# Patient Record
Sex: Male | Born: 1980 | Race: White | Hispanic: No | Marital: Single
Health system: Southern US, Community
[De-identification: ages and names within clinical notes are randomized; demographics above are authoritative.]

## PROBLEM LIST (undated history)

## (undated) HISTORY — PX: HERNIA REPAIR: SHX51

---

## 2010-12-09 ENCOUNTER — Other Ambulatory Visit: Payer: Self-pay | Admitting: Psychiatry

## 2010-12-09 ENCOUNTER — Ambulatory Visit
Admission: RE | Admit: 2010-12-09 | Discharge: 2010-12-09 | Disposition: A | Discharge status: Home / Self Care | Payer: No Typology Code available for payment source | Source: Ambulatory Visit | Attending: Psychiatry | Admitting: Psychiatry

## 2010-12-09 DIAGNOSIS — R7611 Nonspecific reaction to tuberculin skin test without active tuberculosis: Secondary | ICD-10-CM

## 2012-08-21 ENCOUNTER — Encounter (HOSPITAL_COMMUNITY): Payer: Self-pay

## 2012-08-21 ENCOUNTER — Emergency Department (HOSPITAL_COMMUNITY): Payer: BC Managed Care – PPO

## 2012-08-21 ENCOUNTER — Emergency Department (HOSPITAL_COMMUNITY)
Admission: EM | Admit: 2012-08-21 | Discharge: 2012-08-21 | Disposition: A | Discharge status: Home / Self Care | Payer: BC Managed Care – PPO | Attending: Emergency Medicine | Admitting: Emergency Medicine

## 2012-08-21 DIAGNOSIS — F1910 Other psychoactive substance abuse, uncomplicated: Secondary | ICD-10-CM

## 2012-08-21 DIAGNOSIS — Y929 Unspecified place or not applicable: Secondary | ICD-10-CM | POA: Insufficient documentation

## 2012-08-21 DIAGNOSIS — R231 Pallor: Secondary | ICD-10-CM | POA: Insufficient documentation

## 2012-08-21 DIAGNOSIS — T401X4A Poisoning by heroin, undetermined, initial encounter: Secondary | ICD-10-CM | POA: Insufficient documentation

## 2012-08-21 DIAGNOSIS — T50901A Poisoning by unspecified drugs, medicaments and biological substances, accidental (unintentional), initial encounter: Secondary | ICD-10-CM

## 2012-08-21 DIAGNOSIS — T401X1A Poisoning by heroin, accidental (unintentional), initial encounter: Secondary | ICD-10-CM

## 2012-08-21 DIAGNOSIS — R002 Palpitations: Secondary | ICD-10-CM | POA: Insufficient documentation

## 2012-08-21 DIAGNOSIS — R000 Tachycardia, unspecified: Secondary | ICD-10-CM | POA: Insufficient documentation

## 2012-08-21 DIAGNOSIS — Y9389 Activity, other specified: Secondary | ICD-10-CM | POA: Insufficient documentation

## 2012-08-21 DIAGNOSIS — F1110 Opioid abuse, uncomplicated: Secondary | ICD-10-CM

## 2012-08-21 LAB — CBC WITH DIFFERENTIAL/PLATELET
Basophils Relative: 0 % (ref 0–1)
Eosinophils Absolute: 0.1 10*3/uL (ref 0.0–0.7)
HCT: 43.3 % (ref 39.0–52.0)
Hemoglobin: 14.2 g/dL (ref 13.0–17.0)
Lymphocytes Relative: 18 % (ref 12–46)
Lymphs Abs: 2.8 10*3/uL (ref 0.7–4.0)
MCV: 88 fL (ref 78.0–100.0)
RDW: 13.2 % (ref 11.5–15.5)
WBC: 15.9 10*3/uL — ABNORMAL HIGH (ref 4.0–10.5)

## 2012-08-21 LAB — POCT I-STAT, CHEM 8
Chloride: 102 mEq/L (ref 96–112)
Creatinine, Ser: 1.1 mg/dL (ref 0.50–1.35)
Glucose, Bld: 137 mg/dL — ABNORMAL HIGH (ref 70–99)
HCT: 46 % (ref 39.0–52.0)
Potassium: 3.5 mEq/L (ref 3.5–5.1)
Sodium: 141 mEq/L (ref 135–145)

## 2012-08-21 LAB — RAPID URINE DRUG SCREEN, HOSP PERFORMED: Opiates: POSITIVE — AB

## 2012-08-21 MED ORDER — SODIUM CHLORIDE 0.9 % IV BOLUS (SEPSIS)
1000.0000 mL | Freq: Once | INTRAVENOUS | Status: AC
  Administered 2012-08-21: 1000 mL via INTRAVENOUS

## 2012-08-21 MED ORDER — ONDANSETRON HCL 4 MG/2ML IJ SOLN
4.0000 mg | Freq: Once | INTRAMUSCULAR | Status: AC
  Administered 2012-08-21: 4 mg via INTRAVENOUS
  Filled 2012-08-21: qty 2

## 2012-08-21 MED ORDER — SODIUM CHLORIDE 0.9 % IV SOLN
Freq: Once | INTRAVENOUS | Status: AC
  Administered 2012-08-21: 04:00:00 via INTRAVENOUS

## 2012-08-21 MED ORDER — ACETAMINOPHEN 325 MG PO TABS
650.0000 mg | ORAL_TABLET | Freq: Once | ORAL | Status: AC
  Administered 2012-08-21: 650 mg via ORAL
  Filled 2012-08-21: qty 2

## 2012-08-21 MED ORDER — NALOXONE HCL 0.4 MG/ML IJ SOLN
0.4000 mg | Freq: Once | INTRAMUSCULAR | Status: AC
  Administered 2012-08-21: 0.4 mg via INTRAVENOUS
  Filled 2012-08-21: qty 1

## 2012-08-21 MED ORDER — NALOXONE HCL 0.4 MG/ML IJ SOLN: 0.4000 mg | Freq: Once | INTRAMUSCULAR | Status: DC

## 2012-08-21 MED ORDER — NALOXONE HCL 1 MG/ML IJ SOLN
2.0000 mg | Freq: Once | INTRAMUSCULAR | Status: AC
  Administered 2012-08-21: 2 mg via INTRAVENOUS
  Filled 2012-08-21: qty 4

## 2012-08-21 NOTE — ED Provider Notes (Signed)
9:00 AM Full response to IV narcan. Now awake and alert. Will continue to monitor in ER  Lyanne Co, MD 08/21/12 0900

## 2012-08-21 NOTE — ED Notes (Signed)
Mild response to sternal rub.  Notified MD that vitals stable but pt mildly responsive.  Additional narcan given.

## 2012-08-21 NOTE — ED Notes (Signed)
Pt placed on oxygen at 2l per [place] for sats in 80's

## 2012-08-21 NOTE — ED Notes (Signed)
[id number]> Expected date:<BR> Expected time:<BR> Means of arrival:<BR> Comments:<BR> EMS, Heroin overdose

## 2012-08-21 NOTE — ED Provider Notes (Signed)
History    CSN: [id number] Arrival date & time 08/21/12  4098  First MD Initiated Contact with Patient 08/21/12 [phone]     Chief Complaint  Patient presents with  . Drug Overdose   (Consider location/radiation/quality/duration/timing/severity/associated sxs/prior Treatment) HPI Comments: Emesis called to the scene.  When patient was unresponsive and not breathing after snorting heroin.  Patient states she does not normally use heroin.  This is his first time. Per EMS report on their arrival, the police were performing respiratory supplementation as patient had stopped breathing.  Patient is a 32 y.o. male presenting with Overdose. The history is provided by the patient and the EMS personnel.  Drug Overdose This is a new problem. The current episode started today. The problem has been gradually improving. Pertinent negatives include no headaches or neck pain. Treatments tried: Narcan. The treatment provided mild relief.   History reviewed. No pertinent past medical history. Past Surgical History  Procedure Laterality Date  . Hernia repair     History reviewed. No pertinent family history. History  Substance Use Topics  . Smoking status: Not on file  . Smokeless tobacco: Not on file  . Alcohol Use: Yes    Review of Systems  HENT: Negative for facial swelling and neck pain.   Respiratory: Negative for shortness of breath and wheezing.   Skin: Positive for pallor.  Neurological: Negative for dizziness and headaches.  All other systems reviewed and are negative.    Allergies  Review of patient's allergies indicates no known allergies.  Home Medications  No current outpatient prescriptions on file. BP 109/89  Pulse 95  Temp(Src) 98.5 F (36.9 C) (Oral)  Resp 18  SpO2 95% Physical Exam  Nursing note and vitals reviewed. Constitutional: He appears well-developed and well-nourished. He appears listless.  HENT:  Right Ear: External ear normal.  Left Ear: External ear  normal.  Mouth/Throat: Oropharynx is clear and moist.  Eyes: Pupils are equal, round, and reactive to light.  Neck: Normal range of motion.  Cardiovascular: Regular rhythm.  Tachycardia present.   Pulmonary/Chest: Effort normal and breath sounds normal. No respiratory distress. He has no wheezes.  Abdominal: Soft.  Lymphadenopathy:    He has no cervical adenopathy.  Neurological: He appears listless. He is not disoriented.  Patient responded properly to questions although slowly, and his response.  He is somnolent  Skin: Skin is warm.    ED Course  Procedures (including critical care time) Labs Reviewed  CBC WITH DIFFERENTIAL - Abnormal; Notable for the following:    WBC 15.9 (*)    Neutro Abs 11.8 (*)    Monocytes Absolute 1.1 (*)    All other components within normal limits  URINE RAPID DRUG SCREEN (HOSP PERFORMED) - Abnormal; Notable for the following:    Opiates POSITIVE (*)    Benzodiazepines POSITIVE (*)    All other components within normal limits  POCT I-STAT, CHEM 8 - Abnormal; Notable for the following:    Glucose, Bld 137 (*)    Calcium, Ion 1.11 (*)    All other components within normal limits  ETHANOL   Dg Chest 2 View  08/21/2012   *RADIOLOGY REPORT*  Clinical Data: Overdose.  Shortness of breath.  CHEST - 2 VIEW  Comparison: 12/09/2010  Findings: Shallow inspiration with atelectasis or infiltration in the lung bases. The heart size and pulmonary vascularity are normal.  No blunting of the costophrenic angles.  No pneumothorax. Mediastinal contours appear intact.  IMPRESSION: Shallow inspiration with atelectasis  or infiltration in the lung bases.   Original Report Authenticated By: Burman Nieves, M.D.   1. Polysubstance abuse   2. Heroin overdose, initial encounter   3. Accidental heroin overdose, initial encounter    ED ECG REPORT   Date: 08/21/2012  EKG Time: 9:52 PM  Rate: 105  Rhythm: sinus tachycardia,  there are no previous tracings available for  comparison  Axis: normal  Intervals:none  ST&T Change: borderline R wave progression   Narrative Interpretation: borderline           MDM   Patient has been given additional Narcan.  Will continue monitoring and observation  Arman Filter, NP 08/21/12 2152

## 2012-08-21 NOTE — Consult Note (Signed)
Reason for Consult:Evaluation for inpatient treatment Referring Physician: EDP  Keith Leonard is an 32 y.o. male.  HPI:  Patient brought in by EMS after heroin overdose.  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.  States that he is home visiting parents and friends.  Patient states that some friends wanted together and have a good time.  States that this was the first time he has tried heroin.  Patient states that he doesn't remember much other than opening his eyes and seeing all of the paramedics.  Patient states that it was not an intentional overdose and he denies suicidal ideation, homicidal ideation, psychosis, and paranoia.    History reviewed. No pertinent past medical history.  Past Surgical History  Procedure Laterality Date  . Hernia repair      History reviewed. No pertinent family history.  Social History:  reports that  drinks alcohol. He reports that he uses illicit drugs. His tobacco history is not on file.  Allergies: No Known Allergies  Medications: I have reviewed the patient's current medications.  Results for orders placed during the hospital encounter of 08/21/12 (from the past 48 hour(s))  CBC WITH DIFFERENTIAL     Status: Abnormal   Collection Time    08/21/12  4:15 AM      Result Value Range   WBC 15.9 (*) 4.0 - 10.5 K/uL   RBC 4.92  4.22 - 5.81 MIL/uL   Hemoglobin 14.2  13.0 - 17.0 g/dL   HCT 14.7  82.9 - 56.2 %   MCV 88.0  78.0 - 100.0 fL   MCH 28.9  26.0 - 34.0 pg   MCHC 32.8  30.0 - 36.0 g/dL   RDW 13.0  86.5 - 78.4 %   Platelets 249  150 - 400 K/uL   Neutrophils Relative % 75  43 - 77 %   Neutro Abs 11.8 (*) 1.7 - 7.7 K/uL   Lymphocytes Relative 18  12 - 46 %   Lymphs Abs 2.8  0.7 - 4.0 K/uL   Monocytes Relative 7  3 - 12 %   Monocytes Absolute 1.1 (*) 0.1 - 1.0 K/uL   Eosinophils Relative 1  0 - 5 %   Eosinophils Absolute 0.1  0.0 - 0.7 K/uL   Basophils Relative 0  0 - 1 %   Basophils Absolute 0.0  0.0 - 0.1 K/uL  POCT I-STAT,  CHEM 8     Status: Abnormal   Collection Time    08/21/12  4:24 AM      Result Value Range   Sodium 141  135 - 145 mEq/L   Potassium 3.5  3.5 - 5.1 mEq/L   Chloride 102  96 - 112 mEq/L   BUN 9  6 - 23 mg/dL   Creatinine, Ser 6.96  0.50 - 1.35 mg/dL   Glucose, Bld 295 (*) 70 - 99 mg/dL   Calcium, Ion 2.84 (*) 1.12 - 1.23 mmol/L   TCO2 26  0 - 100 mmol/L   Hemoglobin 15.6  13.0 - 17.0 g/dL   HCT 13.2  44.0 - 10.2 %  ETHANOL     Status: None   Collection Time    08/21/12  8:06 AM      Result Value Range   Alcohol, Ethyl (B) <11  0 - 11 mg/dL   Comment:            LOWEST DETECTABLE LIMIT FOR     SERUM ALCOHOL IS 11 mg/dL  FOR MEDICAL PURPOSES ONLY  URINE RAPID DRUG SCREEN (HOSP PERFORMED)     Status: Abnormal   Collection Time    08/21/12  8:52 AM      Result Value Range   Opiates POSITIVE (*) NONE DETECTED   Cocaine NONE DETECTED  NONE DETECTED   Benzodiazepines POSITIVE (*) NONE DETECTED   Amphetamines NONE DETECTED  NONE DETECTED   Tetrahydrocannabinol NONE DETECTED  NONE DETECTED   Barbiturates NONE DETECTED  NONE DETECTED   Comment:            DRUG SCREEN FOR MEDICAL PURPOSES     ONLY.  IF CONFIRMATION IS NEEDED     FOR ANY PURPOSE, NOTIFY LAB     WITHIN 5 DAYS.                LOWEST DETECTABLE LIMITS     FOR URINE DRUG SCREEN     Drug Class       Cutoff (ng/mL)     Amphetamine      1000     Barbiturate      200     Benzodiazepine   200     Tricyclics       300     Opiates          300     Cocaine          300     THC              50    Dg Chest 2 View  08/21/2012   *RADIOLOGY REPORT*  Clinical Data: Overdose.  Shortness of breath.  CHEST - 2 VIEW  Comparison: 12/09/2010  Findings: Shallow inspiration with atelectasis or infiltration in the lung bases. The heart size and pulmonary vascularity are normal.  No blunting of the costophrenic angles.  No pneumothorax. Mediastinal contours appear intact.  IMPRESSION: Shallow inspiration with atelectasis or infiltration  in the lung bases.   Original Report Authenticated By: Burman Nieves, M.D.    Review of Systems  Psychiatric/Behavioral: Positive for substance abuse (Heroin, patient states first time use.  History of other substance use also). Negative for depression, suicidal ideas, hallucinations and memory loss. The patient is not nervous/anxious and does not have insomnia.    Blood pressure 109/89, pulse 95, temperature 98.5 F (36.9 C), temperature source Oral, resp. rate 18, SpO2 95.00%. Physical Exam  Constitutional: He is oriented to person, place, and time. He appears well-developed and well-nourished.  HENT:  Head: Normocephalic and atraumatic.  Neck: Normal range of motion.  Respiratory: Effort normal.  Neurological: He is alert and oriented to person, place, and time.  Skin: Skin is warm and dry.  Psychiatric: His speech is normal and behavior is normal. His mood appears not anxious. His affect is not angry. Thought content is not paranoid and not delusional. Cognition and memory are normal. He does not exhibit a depressed mood. He expresses no homicidal and no suicidal ideation.  Patient states that he is mostly ashamed and grateful for being alive.      Assessment/Plan: Axis I: Substance Abuse and Accidental heroin overdose Discharge patient home when medically stable.    Rankin, Shuvon, FNP-BC 08/21/2012, 10:41 AM     I personally seen the patient agreed with the findings and involved in the treatment plan.

## 2012-08-21 NOTE — ED Provider Notes (Signed)
This is a sign out from NP Tomasa Blase at shift change: Keith Leonard is a 32 y.o. male brought in by EMS for heroin overdose. Patient snorted heroin for the first time last night. He has been taking Xanax as well according to his friend. Patient has been given 2 doses of Narcan, he had emesis on scene. Chest x-ray shows shallow inspiration with atelectasis versus infiltrate in the bilateral bases. Because it is bilateral and do not think that this is a true infiltrate and does not require treatment at this time.   8:12 AM Pt seen and  reevaluated, he remains somnolent but responsive to voice and light physical stimulation. Sats remained in the low 90s when supplemental oxygen is DC'd. Patient will be given 2 mg of Narcan.   Patient has excellent response to larger dose of Narcan. He remains awake, saturating well and protecting his airway.  He's been observed for 4 hours, has passed trial of ambulation without hypoxia, is tolerating by mouth.  Pt is hemodynamically stable, appropriate for, and amenable to discharge at this time. Pt verbalized understanding and agrees with care plan. Outpatient follow-up and specific return precautions discussed.     Filed Vitals:   08/21/12 0530 08/21/12 0545 08/21/12 0600 08/21/12 1028  BP:    109/89  Pulse: 96 94 93 95  Temp:    98.5 F (36.9 C)  TempSrc:    Oral  Resp: 11 9 9 18   SpO2: 96% 96% 95% 95%     Wynetta Emery, PA-C 08/21/12 1052

## 2012-08-21 NOTE — ED Notes (Signed)
Pt tolerated fluids without difficulty, ambulated pt with no assistance, oxygen sat 91% and pulse 95.

## 2012-08-22 NOTE — ED Provider Notes (Signed)
Medical screening examination/treatment/procedure(s) were performed by non-physician practitioner and as supervising physician I was immediately available for consultation/collaboration.  Jones Skene, M.D.     Jones Skene, MD 08/22/12 1610

## 2012-08-22 NOTE — ED Provider Notes (Signed)
Medical screening examination/treatment/procedure(s) were performed by non-physician practitioner and as supervising physician I was immediately available for consultation/collaboration.  Jones Skene, M.D.     Jones Skene, MD 08/22/12 0403

## 2019-09-02 ENCOUNTER — Emergency Department
Admission: EM | Admit: 2019-09-02 | Discharge: 2019-09-04 | Disposition: A | Discharge status: Home / Self Care | Payer: BC Managed Care – PPO | Attending: Emergency Medicine | Admitting: Emergency Medicine

## 2019-09-02 ENCOUNTER — Emergency Department: Payer: BC Managed Care – PPO

## 2019-09-02 ENCOUNTER — Other Ambulatory Visit: Payer: Self-pay

## 2019-09-02 DIAGNOSIS — T50902A Poisoning by unspecified drugs, medicaments and biological substances, intentional self-harm, initial encounter: Secondary | ICD-10-CM

## 2019-09-02 DIAGNOSIS — Y939 Activity, unspecified: Secondary | ICD-10-CM | POA: Insufficient documentation

## 2019-09-02 DIAGNOSIS — T424X1A Poisoning by benzodiazepines, accidental (unintentional), initial encounter: Secondary | ICD-10-CM | POA: Diagnosis present

## 2019-09-02 DIAGNOSIS — X58XXXA Exposure to other specified factors, initial encounter: Secondary | ICD-10-CM | POA: Insufficient documentation

## 2019-09-02 DIAGNOSIS — Y929 Unspecified place or not applicable: Secondary | ICD-10-CM | POA: Insufficient documentation

## 2019-09-02 DIAGNOSIS — T407X1A Poisoning by cannabis (derivatives), accidental (unintentional), initial encounter: Secondary | ICD-10-CM | POA: Insufficient documentation

## 2019-09-02 DIAGNOSIS — R4182 Altered mental status, unspecified: Secondary | ICD-10-CM | POA: Diagnosis not present

## 2019-09-02 DIAGNOSIS — Y999 Unspecified external cause status: Secondary | ICD-10-CM | POA: Insufficient documentation

## 2019-09-02 LAB — URINE DRUG SCREEN, QUALITATIVE (ARMC ONLY)
Amphetamines, Ur Screen: NOT DETECTED
Barbiturates, Ur Screen: NOT DETECTED
Benzodiazepine, Ur Scrn: POSITIVE — AB
Cannabinoid 50 Ng, Ur ~~LOC~~: POSITIVE — AB
Cocaine Metabolite,Ur ~~LOC~~: NOT DETECTED
MDMA (Ecstasy)Ur Screen: NOT DETECTED
Methadone Scn, Ur: NOT DETECTED
Opiate, Ur Screen: NOT DETECTED
Phencyclidine (PCP) Ur S: NOT DETECTED
Tricyclic, Ur Screen: NOT DETECTED

## 2019-09-02 LAB — CBC WITH DIFFERENTIAL/PLATELET
Abs Immature Granulocytes: 0.03 10*3/uL (ref 0.00–0.07)
Basophils Absolute: 0.1 10*3/uL (ref 0.0–0.1)
Basophils Relative: 1 %
Eosinophils Absolute: 0.3 10*3/uL (ref 0.0–0.5)
Eosinophils Relative: 3 %
HCT: 38.1 % — ABNORMAL LOW (ref 39.0–52.0)
Hemoglobin: 12.5 g/dL — ABNORMAL LOW (ref 13.0–17.0)
Immature Granulocytes: 0 %
Lymphocytes Relative: 29 %
Lymphs Abs: 2.9 10*3/uL (ref 0.7–4.0)
MCH: 28.7 pg (ref 26.0–34.0)
MCHC: 32.8 g/dL (ref 30.0–36.0)
MCV: 87.6 fL (ref 80.0–100.0)
Monocytes Absolute: 0.9 10*3/uL (ref 0.1–1.0)
Monocytes Relative: 9 %
Neutro Abs: 5.8 10*3/uL (ref 1.7–7.7)
Neutrophils Relative %: 58 %
Platelets: 326 10*3/uL (ref 150–400)
RBC: 4.35 MIL/uL (ref 4.22–5.81)
RDW: 13.4 % (ref 11.5–15.5)
WBC: 10 10*3/uL (ref 4.0–10.5)
nRBC: 0 % (ref 0.0–0.2)

## 2019-09-02 LAB — URINALYSIS, COMPLETE (UACMP) WITH MICROSCOPIC
Bacteria, UA: NONE SEEN
Bilirubin Urine: NEGATIVE
Glucose, UA: NEGATIVE mg/dL
Hgb urine dipstick: NEGATIVE
Ketones, ur: NEGATIVE mg/dL
Leukocytes,Ua: NEGATIVE
Nitrite: NEGATIVE
Protein, ur: NEGATIVE mg/dL
Specific Gravity, Urine: 1.002 — ABNORMAL LOW (ref 1.005–1.030)
Squamous Epithelial / LPF: NONE SEEN (ref 0–5)
pH: 8 (ref 5.0–8.0)

## 2019-09-02 LAB — COMPREHENSIVE METABOLIC PANEL
ALT: 11 U/L (ref 0–44)
AST: 21 U/L (ref 15–41)
Albumin: 3.7 g/dL (ref 3.5–5.0)
Alkaline Phosphatase: 58 U/L (ref 38–126)
Anion gap: 6 (ref 5–15)
BUN: 6 mg/dL (ref 6–20)
CO2: 31 mmol/L (ref 22–32)
Calcium: 8.4 mg/dL — ABNORMAL LOW (ref 8.9–10.3)
Chloride: 101 mmol/L (ref 98–111)
Creatinine, Ser: 0.73 mg/dL (ref 0.61–1.24)
GFR calc Af Amer: >60 mL/min (ref >60)
GFR calc non Af Amer: >60 mL/min (ref >60)
Glucose, Bld: 108 mg/dL — ABNORMAL HIGH (ref 70–99)
Potassium: 3.8 mmol/L (ref 3.5–5.1)
Sodium: 138 mmol/L (ref 135–145)
Total Bilirubin: 0.7 mg/dL (ref 0.3–1.2)
Total Protein: 7 g/dL (ref 6.5–8.1)

## 2019-09-02 LAB — ETHANOL: Alcohol, Ethyl (B): <10 mg/dL (ref <10)

## 2019-09-02 MED ORDER — NALOXONE HCL 2 MG/2ML IJ SOSY
0.4000 mg | PREFILLED_SYRINGE | Freq: Once | INTRAMUSCULAR | Status: AC
  Administered 2019-09-02: 0.4 mg via INTRAVENOUS
  Filled 2019-09-02: qty 2

## 2019-09-02 MED ORDER — FLUMAZENIL 0.5 MG/5ML IV SOLN
0.2000 mg | Freq: Once | INTRAVENOUS | Status: AC
  Administered 2019-09-02: 0.2 mg via INTRAVENOUS
  Filled 2019-09-02: qty 5

## 2019-09-02 MED ORDER — FLUMAZENIL 0.5 MG/5ML IV SOLN
0.5000 mg | Freq: Once | INTRAVENOUS | Status: DC
  Filled 2019-09-02: qty 5

## 2019-09-02 MED ORDER — SODIUM CHLORIDE 0.9 % IV SOLN: Freq: Once | INTRAVENOUS | Status: AC

## 2019-09-02 NOTE — ED Notes (Signed)
Pt sleeping. 

## 2019-09-02 NOTE — ED Notes (Signed)
Contacted [facility] for patient medications. Was informed that patient arrived to the facility for admission, but condition prevented intake. Staff confirms patient is NOT a current patient/resident of [facility] but apparently was expected to be accepted for treatment.  ** The above is intended solely for informational and/or communicative purposes. It should in no way be considered an endorsement of any specific treatment, therapy or action. **

## 2019-09-02 NOTE — ED Notes (Signed)
Pt discharged per dr Robbi Garter instructions.  Iv's dc'ed.  Pt alert talking on phone for someone to come pick him up.

## 2019-09-02 NOTE — ED Notes (Signed)
VOL waiting to go to [facility] in morning

## 2019-09-02 NOTE — ED Notes (Signed)
Shelda Pal (friend) [phone] states [facility] will have bed available tomorrow for him. I have consulted with TTS to coordinate discharge there tomorrow. Lorri Frederick states he is willing to pick him up and take him there tomorrow.

## 2019-09-02 NOTE — ED Provider Notes (Signed)
Patient is now more awake. Able to ambulate around his room. Will plan on trying to send back to [facility].   Phineas Semen, MD 09/02/19 2036

## 2019-09-02 NOTE — ED Provider Notes (Signed)
ER Provider Note       Time seen: 10:09 AM    I have reviewed the vital signs and the nursing notes.  HISTORY   Chief Complaint Drug Overdose Level V caveat: History/ROS limited by altered mental status   HPI Keith Leonard is a 39 y.o. male with no significant past medical history who presents today for possible liquid Xanax ingestion.  Patient comes from [facility], was ambulatory upon arrival but became unconscious during transport to the ER.  He was unresponsive on arrival but vital signs were stable.  No past medical history on file.  Past Surgical History:  Procedure Laterality Date  . HERNIA REPAIR      Allergies Patient has no known allergies.  Review of Systems Unknown, patient arrives unresponsive  All systems negative/normal/unremarkable except as stated in the HPI  ____________________________________________   PHYSICAL EXAM:  VITAL SIGNS: Vitals:   09/02/19 1006  BP: (!) 132/97  Pulse: (!) 43  Resp: 18  Temp: (!) 97 F (36.1 C)  SpO2: 100%    Constitutional: Lethargic, poorly responsive Eyes: Conjunctivae are normal.  ENT      Head: Normocephalic and atraumatic.      Nose: No congestion/rhinnorhea.      Mouth/Throat: Mucous membranes are moist.      Neck: No stridor. Cardiovascular: Slow rate, regular rhythm. No murmurs, rubs, or gallops. Respiratory: Normal respiratory effort without tachypnea nor retractions. Breath sounds are clear and equal bilaterally. No wheezes/rales/rhonchi. Gastrointestinal: Soft and nontender. Normal bowel sounds Musculoskeletal: Nontender with normal range of motion in extremities. No lower extremity tenderness nor edema. Neurologic:  Normal speech and language. No gross focal neurologic deficits are appreciated.  Skin:  Skin is warm, dry and intact. No rash noted. Psychiatric: Speech and behavior are normal.  ____________________________________________  EKG: Interpreted by me.  Sinus rhythm with rate of  78 bpm, prolonged PR interval, nonspecific ST segment changes, long QT  ____________________________________________   LABS (pertinent positives/negatives)  Labs Reviewed  CBC WITH DIFFERENTIAL/PLATELET - Abnormal; Notable for the following components:      Result Value   Hemoglobin 12.5 (*)    HCT 38.1 (*)    All other components within normal limits  COMPREHENSIVE METABOLIC PANEL - Abnormal; Notable for the following components:   Glucose, Bld 108 (*)    Calcium 8.4 (*)    All other components within normal limits  URINALYSIS, COMPLETE (UACMP) WITH MICROSCOPIC - Abnormal; Notable for the following components:   Color, Urine STRAW (*)    APPearance CLEAR (*)    Specific Gravity, Urine 1.002 (*)    All other components within normal limits  URINE DRUG SCREEN, QUALITATIVE ([facility] ONLY) - Abnormal; Notable for the following components:   Cannabinoid 50 Ng, Ur [place] POSITIVE (*)    Benzodiazepine, Ur Scrn POSITIVE (*)    All other components within normal limits  ETHANOL  CBG MONITORING, ED   DIFFERENTIAL DIAGNOSIS  Overdose, ingestion, intoxication  ASSESSMENT AND PLAN  Overdose   Plan: The patient had presented for an overdose. Patient's labs were positive for benzodiazepines and marijuana.  He did require a dose of Narcan without any improvement, we also gave him flumazenil which did help his symptoms.  He subsequently has woken up and urinated.  We will try to send him back to the [facility].  Daryel November MD    Note: This note was generated in part or whole with voice recognition software. Voice recognition is usually quite accurate but  there are transcription errors that can and very often do occur. I apologize for any typographical errors that were not detected and corrected.     Emily Filbert, MD 09/02/19 1210

## 2019-09-02 NOTE — ED Notes (Signed)
Resumed care from dejea rn.  Pt sleeping.  Sinus brady on monitor   Pt arouseable.  Iv in place.

## 2019-09-02 NOTE — ED Triage Notes (Signed)
Pt arrived via EMS from [facility] d/t being under the influence of "liquid xanax" per friend at [facility]. EMS reports pt was ambulatory upon arrival but become unconscious during transport to [facility] ED. Pt is currently unresponsive but VS stable at this time.

## 2019-09-02 NOTE — ED Notes (Signed)
Pt alert and talking to md  Pt requesting phone to call a friend.

## 2019-09-02 NOTE — ED Notes (Signed)
Pt sleeping  Sinus brady on monitor.   

## 2019-09-02 NOTE — Discharge Instructions (Addendum)
Please seek medical attention and help for any thoughts about wanting to harm yourself, harm others, any concerning change in behavior, severe depression, inappropriate drug use or any other new or concerning symptoms. ° °

## 2019-09-03 NOTE — ED Notes (Signed)
Pt given the phone to speak with his friend Jorja Loa.

## 2019-09-03 NOTE — BH Assessment (Signed)
Writer spoke with pt's friend Shelda Pal [phone]) who verified that he would pick the patient up and transport [facility] to [facility]. Tim then provided a contact number for the [facility] admission nurse.    Writer contacted [facility] Nurse Raynelle Fanning [phone]) to coordinate care. Raynelle Fanning requested pt's lab work and a med clearance note be faxed to [phone]. Task completed at 11pm.

## 2019-09-03 NOTE — ED Notes (Signed)
Lunch tray placed on bed.

## 2019-09-03 NOTE — BH Assessment (Addendum)
TTS was contacted by [facility] staff Dow Adolph [phone]) reporting pt to be unable to be admitted today and request arrangements be made for tomorrow at 10:30am.   Pt was provided an update from TTS. Pt is voluntary but agreed to the plan in place. (Remain in the hospital overnight to be transported to [facility] tomorrow morning for an appointment scheduled at 10:30)  TTS was contacted by Jorja Loa [phone]) confirming pt's plan to stay and transportation for tomorrow. Pt current treatment staff made aware of plan (Amy, RN, Dr. Katrinka Blazing & Secretary Misty Stanley) Jorja Loa will transport pt in the morning.

## 2019-09-03 NOTE — ED Notes (Signed)
Vol/  going  to  [facility]  in  the  am  (a friend  name Jorja Loa  will  take  pt

## 2019-09-03 NOTE — ED Notes (Signed)
Meal tray placed on bed 

## 2019-09-03 NOTE — ED Provider Notes (Signed)
Emergency Medicine Observation Re-evaluation Note  Gurjit Swaz[place]and is a 39 y.o. male, seen on rounds today.  Pt initially presented to the ED for complaints of Drug Overdose Currently, the patient is resting quietly.  Physical Exam  BP (!) 148/81   Pulse 80   Temp (!) 97 F (36.1 C) (Axillary)   Resp 14   Ht 1.829 m (6')   Wt 86.2 kg   SpO2 (!) 32%   BMI 25.77 kg/m  Physical Exam  General: No acute distress Resp:  Breathing easily and comfortably Neuro:No focal neuro deficits, no tremulousness Psych:  calm and cooperative   ED Course / MDM  EKG:EKG Interpretation  Date/Time:  Monday September 02 2019 10:05:49 EDT Ventricular Rate:  78 PR Interval:    QRS Duration: 93 QT Interval:  441 QTC Calculation: 503 R Axis:   27 Text Interpretation: Sinus rhythm Atrial premature complex Prolonged PR interval ST elev, probable normal early repol pattern Prolonged QT interval Baseline wander in lead(s) V6 Confirmed by UNCONFIRMED, DOCTOR ([id number]), editor Fredric Mare, Tammy [phone]) on 09/03/2019 10:49:20 AM    I have reviewed the labs performed to date as well as medications administered while in observation.  Recent changes in the last 24 hours include evaluation by social work and TTS. Plan  Current plan is for discharge to [facility].  Unfortunately, they are not able to take the patient back today.  Currently plan is for discharge tomorrow.  Patient is here voluntarily.  Patient will be updated to the plan by social work and/or TTS.  Patient is not under full IVC at this time.   Loleta Rose, MD 09/03/19 1056

## 2019-09-04 NOTE — ED Provider Notes (Signed)
Emergency Medicine Observation Re-evaluation Note  Brandley Swaziland is a 39 y.o. male, seen on rounds today.  Pt initially presented to the ED for complaints of Drug Overdose Currently, the patient is resting comfortably with no events during my shift.  Physical Exam  BP 125/87   Pulse 63   Temp 98.1 F (36.7 C) (Oral)   Resp 18   Ht 1.829 m (6')   Wt 86.2 kg   SpO2 98%   BMI 25.77 kg/m  Physical Exam  ED Course / MDM  EKG:EKG Interpretation  Date/Time:  Monday September 02 2019 10:05:49 EDT Ventricular Rate:  78 PR Interval:    QRS Duration: 93 QT Interval:  441 QTC Calculation: 503 R Axis:   27 Text Interpretation: Sinus rhythm Atrial premature complex Prolonged PR interval ST elev, probable normal early repol pattern Prolonged QT interval Baseline wander in lead(s) V6 Confirmed by UNCONFIRMED, DOCTOR ([id number]), editor Fredric Mare, Tammy [phone]) on 09/03/2019 10:49:20 AM    I have reviewed the labs performed to date as well as medications administered while in observation.   Plan  Current plan is for discharge to [facility]. Patient is not under full IVC at this time.   Darci Current, MD 09/04/19 [phone]

## 2022-02-05 IMAGING — CT CT HEAD W/O CM
3 series · 16 of 47 positions shown, 19 images · non-contrast
Comparison: None.

CLINICAL DATA: Encephalopathy, unresponsive

EXAM:
CT HEAD WITHOUT CONTRAST
TECHNIQUE: Contiguous axial images were obtained from the base of the skull
through the vertex without intravenous contrast.

[Series 2: head wo · axial · 0.44mm/px · z∈[+253,+393]mm · 10 of 34 slices shown, 13 images]
[im 3/34  brain]
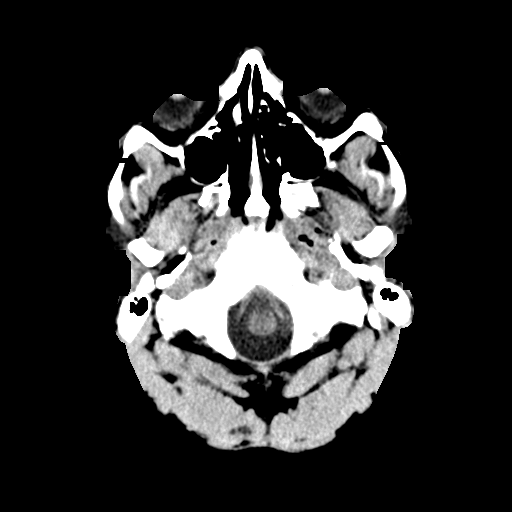
[im 3/34  bone]
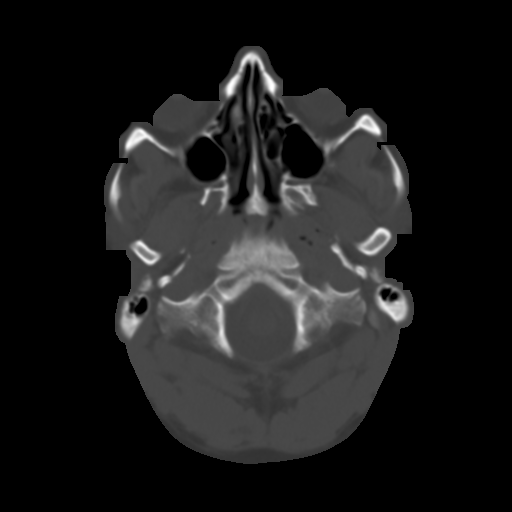
[im 6/34  brain]
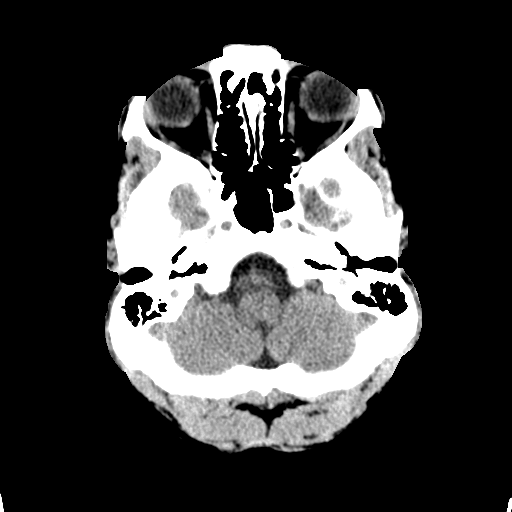
[im 10/34  brain]
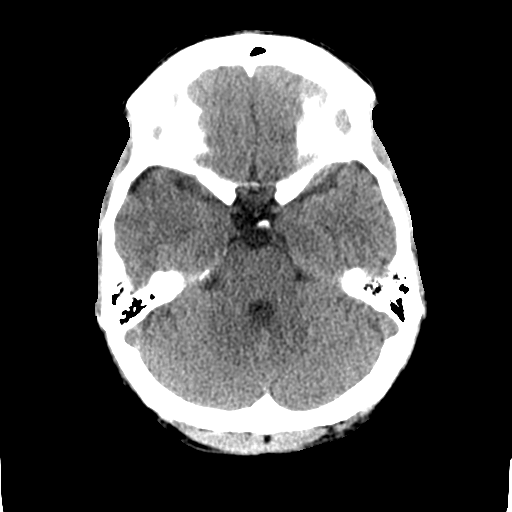
[im 12/34  brain]
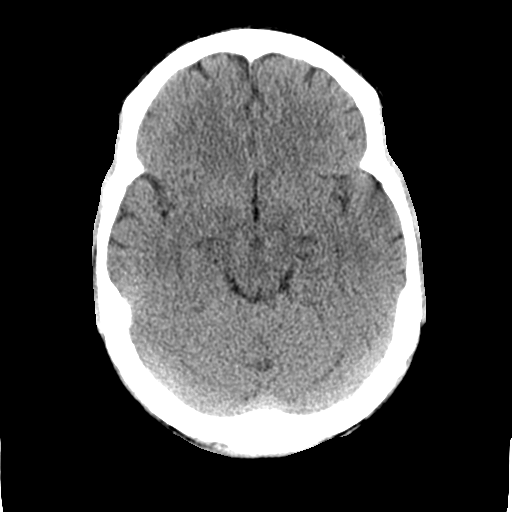
[im 15/34  brain]
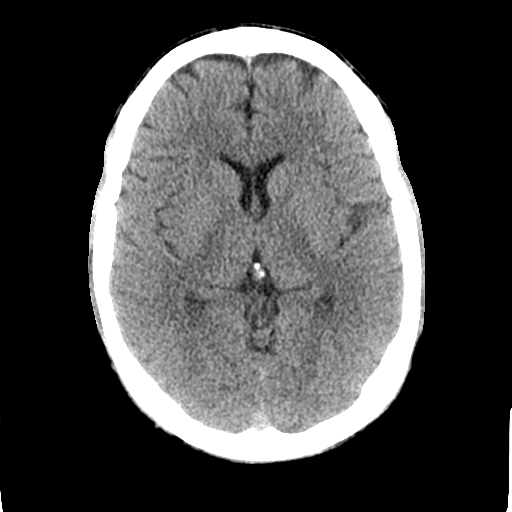
[im 15/34  bone]
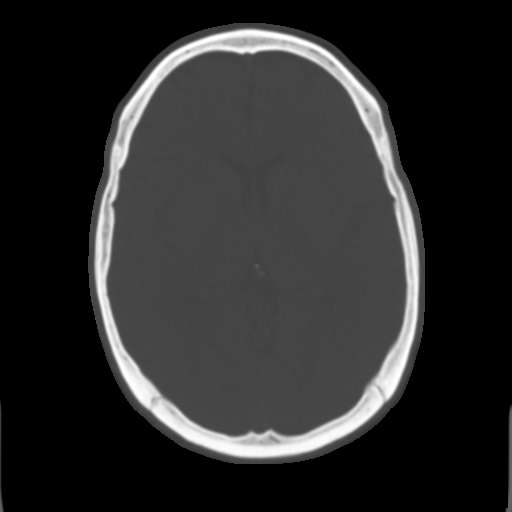
[im 19/34  brain]
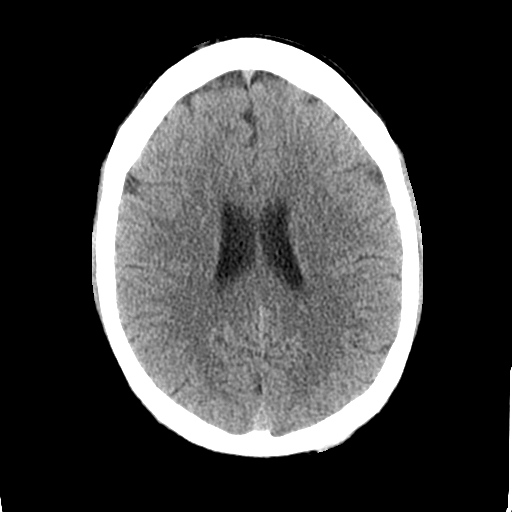
[im 22/34  brain]
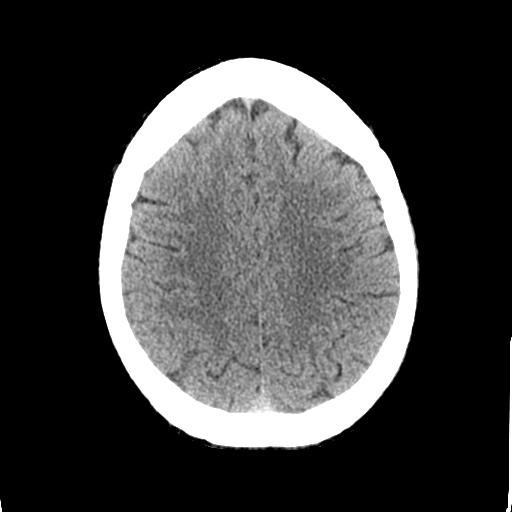
[im 26/34  brain]
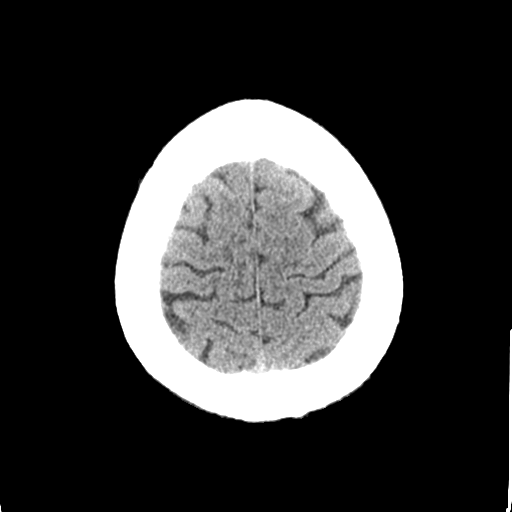
[im 28/34  brain]
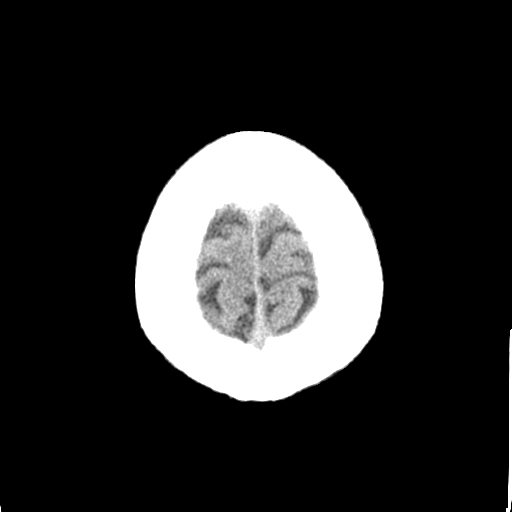
[im 28/34  bone]
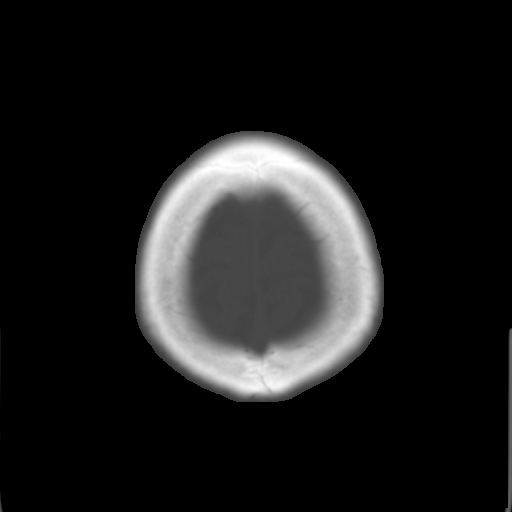
[im 31/34  brain]
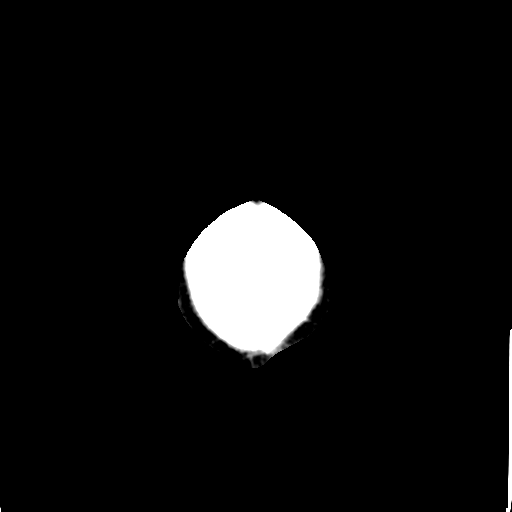

[Series 4: coronal soft tissue · coronal · 0.35mm/px · 3 of 71 slices shown]
[im 24/71  brain]
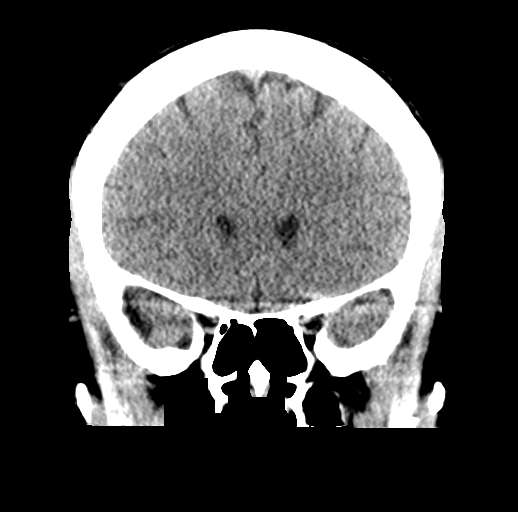
[im 32/71  brain]
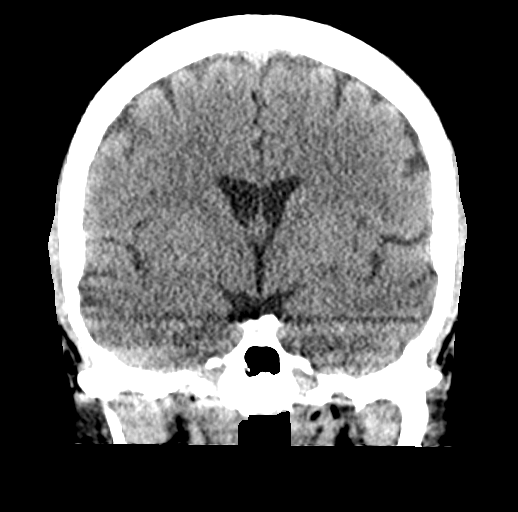
[im 39/71  brain]
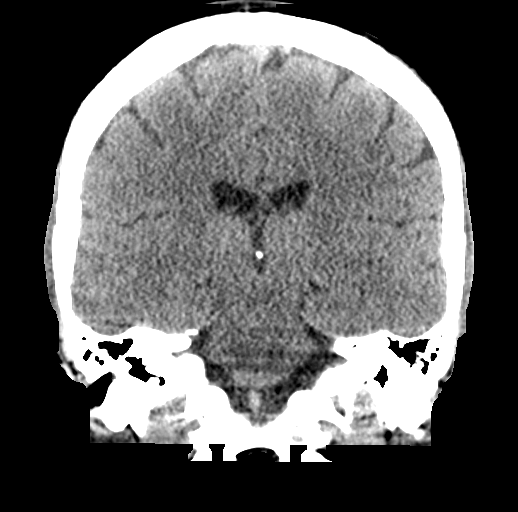

[Series 5: sagittal soft tissue · sagittal · 0.35mm/px · 3 of 59 slices shown]
[im 20/59  brain]
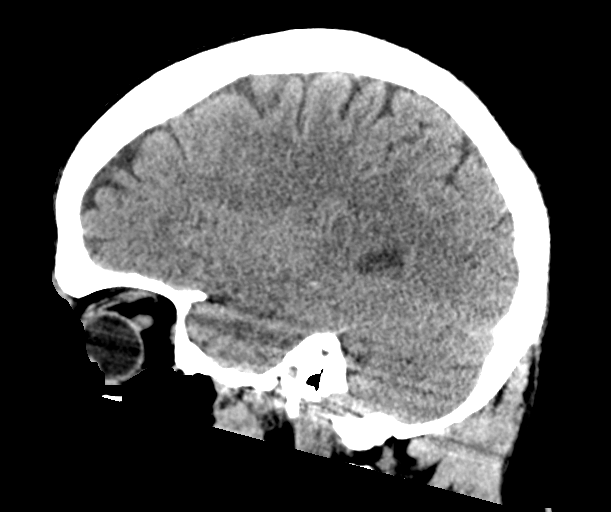
[im 30/59  brain]
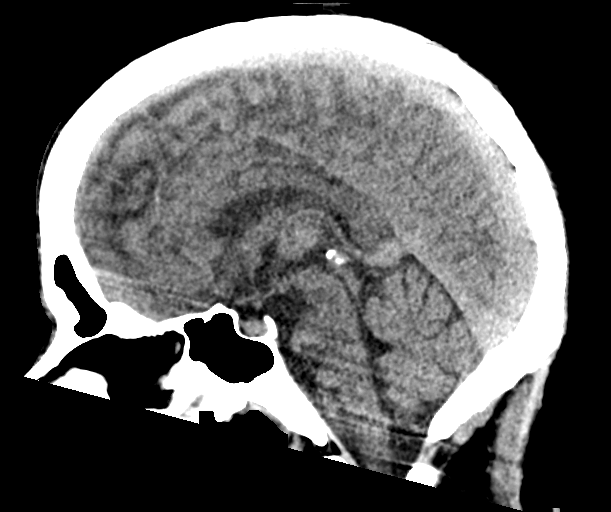
[im 39/59  brain]
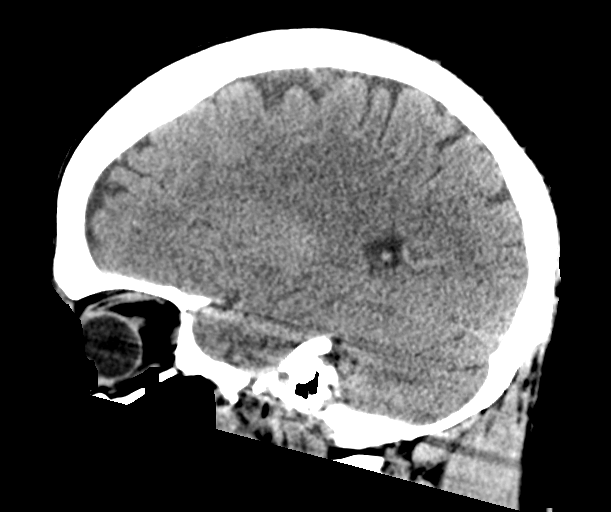

[16 of 47 positions shown; findings below may reference images not displayed]

FINDINGS: Brain: Normal anatomic configuration. No abnormal intra or
extra-axial mass lesion or fluid collection. No abnormal mass effect
or midline shift. No evidence of acute intracranial hemorrhage or
infarct. Ventricular size is normal. Cerebellum unremarkable.

Vascular: Unremarkable

Skull: Intact

Sinuses/Orbits: Paranasal sinuses are clear. Orbits are
unremarkable.

Other: Mastoid air cells and middle ear cavities are clear.
IMPRESSION: Normal examination
